# Patient Record
Sex: Female | Born: 1965 | Race: White | Hispanic: No | Smoking: Current every day smoker
Health system: Southern US, Community
[De-identification: ages and names within clinical notes are randomized; demographics above are authoritative.]

## PROBLEM LIST (undated history)

## (undated) DIAGNOSIS — E119 Type 2 diabetes mellitus without complications: Secondary | ICD-10-CM

## (undated) HISTORY — PX: JOINT REPLACEMENT: SHX530

## (undated) HISTORY — PX: ABDOMINAL HYSTERECTOMY: SHX81

## (undated) HISTORY — PX: CERVICAL FUSION: SHX112

---

## 2015-06-28 ENCOUNTER — Encounter: Payer: Self-pay | Admitting: *Deleted

## 2015-06-28 ENCOUNTER — Ambulatory Visit (INDEPENDENT_AMBULATORY_CARE_PROVIDER_SITE_OTHER): Payer: Self-pay

## 2015-06-28 ENCOUNTER — Ambulatory Visit
Admission: EM | Admit: 2015-06-28 | Discharge: 2015-06-28 | Disposition: A | Discharge status: Home / Self Care | Payer: Self-pay | Attending: Family Medicine | Admitting: Family Medicine

## 2015-06-28 DIAGNOSIS — M79671 Pain in right foot: Secondary | ICD-10-CM

## 2015-06-28 DIAGNOSIS — S96911A Strain of unspecified muscle and tendon at ankle and foot level, right foot, initial encounter: Secondary | ICD-10-CM

## 2015-06-28 HISTORY — DX: Type 2 diabetes mellitus without complications: E11.9

## 2015-06-28 MED ORDER — MELOXICAM 15 MG PO TABS: 15.0000 mg | ORAL_TABLET | Freq: Every day | ORAL | Status: DC

## 2015-06-28 NOTE — ED Provider Notes (Signed)
CSN: [id number]     Arrival date & time 06/28/15  1727 History   First MD Initiated Contact with Patient 06/28/15 1808      Nurses notes were reviewed. Chief Complaint  Patient presents with  . Foot Pain  Patient reports over the last week or so right foot pain. She states is painful when she walks. Asked pain has improved over the last day or 2 but she still has difficulty when she walks and ambulates. She is a diabetic who has been off medication because of locks of insurance after her hip surgery. She states that she is planning to get back onto her insurance and is finally brought smoker medication for diabetes since she is back at work. Patient however still smokes and does not have a problem buying her cigarettes and go without medication.  No family history of gout. Past surgical history she's had a hysterectomy and 2 joint replacements. Medical problems diabetes. She is allergic to penicillin and sulfa.  (Consider location/radiation/quality/duration/timing/severity/associated sxs/prior Treatment) Patient is a 50 y.o. female presenting with lower extremity pain. The history is provided by the patient. No language interpreter was used.  Foot Pain This is a new problem. The current episode started more than 1 week ago. The problem has been gradually improving. Pertinent negatives include no chest pain, no abdominal pain, no headaches and no shortness of breath. The symptoms are aggravated by walking. Nothing relieves the symptoms. She has tried nothing for the symptoms. The treatment provided no relief.    Past Medical History  Diagnosis Date  . Diabetes mellitus without complication [facility])    Past Surgical History  Procedure Laterality Date  . Abdominal hysterectomy    . Joint replacement    . Cervical fusion     History reviewed. No pertinent family history. Social History  Substance Use Topics  . Smoking status: Current Every Day Smoker  . Smokeless tobacco: Never Used  .  Alcohol Use: No   OB History    No data available     Review of Systems  Respiratory: Negative for shortness of breath.   Cardiovascular: Negative for chest pain.  Gastrointestinal: Negative for abdominal pain.  Neurological: Negative for headaches.  All other systems reviewed and are negative.   Allergies  Penicillins and Sulfa antibiotics  Home Medications   Prior to Admission medications   Medication Sig Start Date End Date Taking? Authorizing Provider  Loratadine 10 MG CAPS Take 1 capsule by mouth daily.   Yes Historical Provider, MD  metFORMIN (GLUCOPHAGE) 500 MG tablet Take 500 mg by mouth 2 (two) times daily with a meal.   Yes Historical Provider, MD  meloxicam (MOBIC) 15 MG tablet Take 1 tablet (15 mg total) by mouth daily. 06/28/15   Hassan Rowan, MD   Meds Ordered and Administered this Visit  Medications - No data to display  BP 139/67 mmHg  Pulse 87  Temp(Src) 98 F (36.7 C) (Oral)  Resp 18  Ht  (1.626 m)  Wt 220 lb (99.791 kg)  BMI 37.74 kg/m2  SpO2 97% No data found.   Physical Exam  Constitutional: She is oriented to person, place, and time. She appears well-developed and well-nourished.  HENT:  Head: Normocephalic and atraumatic.  Eyes: Conjunctivae are normal. Pupils are equal, round, and reactive to light.  Musculoskeletal: Normal range of motion.       Feet:  Neurological: She is alert and oriented to person, place, and time.  Skin: Skin is warm  and dry. No erythema.  Psychiatric: She has a normal mood and affect.  Vitals reviewed.   ED Course  Procedures (including critical care time)  Labs Review Labs Reviewed - No data to display  Imaging Review Dg Foot Complete Right  06/28/2015  CLINICAL DATA:  Pain for 2 weeks.  No recent trauma. EXAM: RIGHT FOOT COMPLETE - 3+ VIEW COMPARISON:  None. FINDINGS: Frontal, oblique, and lateral views were obtained. There is soft tissue swelling over the dorsum of the midfoot. No fracture or  dislocation. The joint spaces appear normal. No erosive change. IMPRESSION: Soft tissue swelling dorsally. No fracture or dislocation. No appreciable arthropathy. Electronically Signed   By: Bretta BangWilliam  Woodruff III M.D.   On: 06/28/2015 18:45     Visual Acuity Review  Right Eye Distance:   Left Eye Distance:   Bilateral Distance:    Right Eye Near:   Left Eye Near:    Bilateral Near:         MDM   1. Right foot strain, initial encounter   2. Foot pain, right    Extensive right foot is negative. Explained to her that this is moreover joint space on her right foot I would be more worried about gout. State this may be secondary to change in gait from the knee surgery. Recommend starting walk on her heel. At this time is no signs of fracture. But once she has insurance she may once be checked for gout especially the pain continues to come back. We'll discuss further gait techniques and possible follow-up with podiatrist if foot pain continues. We'll place on Mobic 15 mg 1 tablet a day   Note: This dictation was prepared with Dragon dictation along with smaller phrase technology. Any transcriptional errors that result from this process are unintentional.    Hassan RowanEugene Irbin Fines, MD 06/28/15 1910

## 2015-06-28 NOTE — ED Notes (Signed)
Patient started having symptoms of right foot pain for 2 weeks. Pain started with third toe of right foot and radiated to the top of her foot. Cause of pain is unknown.

## 2015-06-28 NOTE — Discharge Instructions (Signed)
Cryotherapy °Cryotherapy is when you put ice on your injury. Ice helps lessen pain and puffiness (swelling) after an injury. Ice works the best when you start using it in the first 24 to 48 hours after an injury. °HOME CARE °· Put a dry or damp towel between the ice pack and your skin. °· You may press gently on the ice pack. °· Leave the ice on for no more than 10 to 20 minutes at a time. °· Check your skin after 5 minutes to make sure your skin is okay. °· Rest at least 20 minutes between ice pack uses. °· Stop using ice when your skin loses feeling (numbness). °· Do not use ice on someone who cannot tell you when it hurts. This includes small children and people with memory problems (dementia). °GET HELP RIGHT AWAY IF: °· You have white spots on your skin. °· Your skin turns blue or pale. °· Your skin feels waxy or hard. °· Your puffiness gets worse. °MAKE SURE YOU:  °· Understand these instructions. °· Will watch your condition. °· Will get help right away if you are not doing well or get worse. °  °This information is not intended to replace advice given to you by your health care provider. Make sure you discuss any questions you have with your health care provider. °  °Document Released: 08/21/2007 Document Revised: 05/27/2011 Document Reviewed: 10/25/2010 °Elsevier Interactive Patient Education ©2016 [employer] ° °Muscle Strain °A muscle strain (pulled muscle) happens when a muscle is stretched beyond normal length. It happens when a sudden, violent force stretches your muscle too far. Usually, a few of the fibers in your muscle are torn. Muscle strain is common in athletes. Recovery usually takes 1-2 weeks. Complete healing takes 5-6 weeks.  °HOME CARE  °· Follow the PRICE method of treatment to help your injury get better. Do this the first 2-3 days after the injury: °¨ Protect. Protect the muscle to keep it from getting injured again. °¨ Rest. Limit your activity and rest the injured body part. °¨ Ice.  Put ice in a plastic bag. Place a towel between your skin and the bag. Then, apply the ice and leave it on from 15-20 minutes each hour. After the third day, switch to moist heat packs. °¨ Compression. Use a splint or elastic bandage on the injured area for comfort. Do not put it on too tightly. °¨ Elevate. Keep the injured body part above the level of your heart. °· Only take medicine as told by your doctor. °· Warm up before doing exercise to prevent future muscle strains. °GET HELP IF:  °· You have more pain or puffiness (swelling) in the injured area. °· You feel numbness, tingling, or notice a loss of strength in the injured area. °MAKE SURE YOU:  °· Understand these instructions. °· Will watch your condition. °· Will get help right away if you are not doing well or get worse. °  °This information is not intended to replace advice given to you by your health care provider. Make sure you discuss any questions you have with your health care provider. °  °Document Released: 12/12/2007 Document Revised: 12/23/2012 Document Reviewed: 10/01/2012 °Elsevier Interactive Patient Education ©2016 Elsevier Inc. ° °

## 2016-03-07 ENCOUNTER — Ambulatory Visit
Admission: EM | Admit: 2016-03-07 | Discharge: 2016-03-07 | Disposition: A | Discharge status: Home / Self Care | Payer: Managed Care, Other (non HMO) | Attending: Internal Medicine | Admitting: Internal Medicine

## 2016-03-07 ENCOUNTER — Ambulatory Visit (INDEPENDENT_AMBULATORY_CARE_PROVIDER_SITE_OTHER): Payer: Managed Care, Other (non HMO)

## 2016-03-07 DIAGNOSIS — I517 Cardiomegaly: Secondary | ICD-10-CM | POA: Diagnosis not present

## 2016-03-07 DIAGNOSIS — F17200 Nicotine dependence, unspecified, uncomplicated: Secondary | ICD-10-CM | POA: Insufficient documentation

## 2016-03-07 DIAGNOSIS — J209 Acute bronchitis, unspecified: Secondary | ICD-10-CM | POA: Insufficient documentation

## 2016-03-07 DIAGNOSIS — Z7984 Long term (current) use of oral hypoglycemic drugs: Secondary | ICD-10-CM | POA: Insufficient documentation

## 2016-03-07 DIAGNOSIS — E119 Type 2 diabetes mellitus without complications: Secondary | ICD-10-CM | POA: Diagnosis not present

## 2016-03-07 DIAGNOSIS — R079 Chest pain, unspecified: Secondary | ICD-10-CM | POA: Diagnosis not present

## 2016-03-07 DIAGNOSIS — Z880 Allergy status to penicillin: Secondary | ICD-10-CM | POA: Insufficient documentation

## 2016-03-07 DIAGNOSIS — R0789 Other chest pain: Secondary | ICD-10-CM

## 2016-03-07 DIAGNOSIS — Z8709 Personal history of other diseases of the respiratory system: Secondary | ICD-10-CM

## 2016-03-07 MED ORDER — ALBUTEROL SULFATE HFA 108 (90 BASE) MCG/ACT IN AERS: 1.0000 | INHALATION_SPRAY | Freq: Four times a day (QID) | RESPIRATORY_TRACT | 0 refills | Status: AC | PRN

## 2016-03-07 MED ORDER — PREDNISONE 50 MG PO TABS: 50.0000 mg | ORAL_TABLET | Freq: Every day | ORAL | 0 refills | Status: AC

## 2016-03-07 MED ORDER — NAPROXEN 500 MG PO TABS: 500.0000 mg | ORAL_TABLET | Freq: Two times a day (BID) | ORAL | 0 refills | Status: DC

## 2016-03-07 MED ORDER — IPRATROPIUM-ALBUTEROL 0.5-2.5 (3) MG/3ML IN SOLN
3.0000 mL | Freq: Once | RESPIRATORY_TRACT | Status: AC
  Administered 2016-03-07: 3 mL via RESPIRATORY_TRACT

## 2016-03-07 NOTE — ED Provider Notes (Signed)
[facility]    CSN: [id number] Arrival date & time: 03/07/16  [id number]     History   Chief Complaint Chief Complaint  Patient presents with  . Chest Pain    Right Rib pain    HPI Sarah Woodard is a 50 y.o. female. she presents today with recent history probable bronchitis/hard coughing onset about 4 weeks ago.  Onset of discomfort under R shoulder blade, rads around to R anterior chest, about 2 weeks ago.  Pleuritic quality, worse with some torso/shoulder motions and with cough/deep breath.  Constant.  Seems to be getting worse.  No leg pain/swelling.  Cough improving, some production.  No fever.  No N/V.  Hx DM, smoking.  Father onset CAD age 7850s.    HPI  Past Medical History:  Diagnosis Date  . Diabetes mellitus without complication [facility](HCC)      Past Surgical History:  Procedure Laterality Date  . ABDOMINAL HYSTERECTOMY    . CERVICAL FUSION    . JOINT REPLACEMENT         Home Medications    Prior to Admission medications   Medication Sig Start Date End Date Taking? Authorizing Provider  FLUoxetine (PROZAC) 20 MG tablet Take 20 mg by mouth daily.   Yes Historical Provider, MD  metFORMIN (GLUCOPHAGE) 500 MG tablet Take 500 mg by mouth 2 (two) times daily with a meal.   Yes Historical Provider, MD  albuterol (PROVENTIL HFA;VENTOLIN HFA) 108 (90 Base) MCG/ACT inhaler Inhale 1-2 puffs into the lungs every 6 (six) hours as needed for wheezing or shortness of breath. 03/07/16   Eustace MooreLaura W Murray, MD  naproxen (NAPROSYN) 500 MG tablet Take 1 tablet (500 mg total) by mouth 2 (two) times daily. 03/07/16   Eustace MooreLaura W Murray, MD  predniSONE (DELTASONE) 50 MG tablet Take 1 tablet (50 mg total) by mouth daily. 03/07/16   Eustace MooreLaura W Murray, MD    Family History Family History  Problem Relation Age of Onset  . Cancer Mother   . Heart attack Father   . Cancer Father     Social History Social History  Substance Use Topics  . Smoking status: Current Every Day Smoker  .  Smokeless tobacco: Never Used  . Alcohol use No     Allergies   Penicillins and Sulfa antibiotics   Review of Systems Review of Systems  All other systems reviewed and are negative.    Physical Exam Triage Vital Signs ED Triage Vitals  Enc Vitals Group     BP 03/07/16 1008 131/66     Pulse Rate 03/07/16 1008 68     Resp 03/07/16 1008 18     Temp 03/07/16 1008 98.1 F (36.7 C)     Temp Source 03/07/16 1008 Oral     SpO2 03/07/16 1008 98 %     Weight 03/07/16 1009 215 lb (97.5 kg)     Height 03/07/16 1009 5\' 4"  (1.626 m)     Pain Score 03/07/16 1010 4   Updated Vital Signs BP 131/66 (BP Location: Left Arm)   Pulse 68   Temp 98.1 F (36.7 C) (Oral)   Resp 18   Ht 5\' 4"  (1.626 m)   Wt 215 lb (97.5 kg)   SpO2 98%   BMI 36.90 kg/m  Physical Exam  Constitutional: She is oriented to person, place, and time. No distress.  Alert, nicely groomed  HENT:  Head: Atraumatic.  Eyes:  Conjugate gaze, no eye redness/drainage  Neck: Neck supple.  No change in discomfort with neck movement including lateral rotation, flexion/extension  Cardiovascular: Normal rate and regular rhythm.   Pulmonary/Chest: No respiratory distress. She has no wheezes. She has no rales.  Slightly coarse but symmetric breath sounds throughout, somewhat diminished posteriorly  Abdominal: She exhibits no distension.  Musculoskeletal: Normal range of motion.  No leg swelling Movement of the right shoulder/internal rotation slightly increases discomfort. Able to raise arm overhead, and externally rotate without difficulty  Neurological: She is alert and oriented to person, place, and time.  Skin: Skin is warm and dry.  No cyanosis  Nursing note and vitals reviewed.    UC Treatments / Results   EKG NSR, no acute ST or T wave changes Voltage criteria for LVH  Radiology EXAM: CHEST 2 VIEW  COMPARISON: None.  FINDINGS: Lower cervical spine fixation. Midline trachea. Normal heart size and  mediastinal contours. No pleural effusion or pneumothorax. Clear lungs.  IMPRESSION: No acute cardiopulmonary disease.   Electronically Signed By: Jeronimo GreavesKyle Talbot M.D. On: 03/07/2016 11:09  Procedures Procedures (including critical care time)  Medications Ordered in UC Medications  ipratropium-albuterol (DUONEB) 0.5-2.5 (3) MG/3ML nebulizer solution 3 mL (3 mLs Nebulization Given 03/07/16 1051)     Final Clinical Impressions(s) / UC Diagnoses   Final diagnoses:  Chest wall pain  History of acute bronchitis with bronchospasm   ECG and chest xray did not suggest a particular cause for symptoms today.  Recent suspected bronchitis may have inflamed the soft tissues in your inner and outer chest, and may take some time to subside. Prescriptions for prednisone, albuterol inhaler, and naproxen (a twice daily anti inflammatory) were sent to the [facility] in [place].  Physical therapy may be an additional option for resolving discomfort.    New Prescriptions Discharge Medication List as of 03/07/2016 11:26 AM    START taking these medications   Details  albuterol (PROVENTIL HFA;VENTOLIN HFA) 108 (90 Base) MCG/ACT inhaler Inhale 1-2 puffs into the lungs every 6 (six) hours as needed for wheezing or shortness of breath., Starting Thu 03/07/2016, Normal    naproxen (NAPROSYN) 500 MG tablet Take 1 tablet (500 mg total) by mouth 2 (two) times daily., Starting Thu 03/07/2016, Normal    predniSONE (DELTASONE) 50 MG tablet Take 1 tablet (50 mg total) by mouth daily., Starting Thu 03/07/2016, Normal         Eustace MooreLaura W Murray, MD 03/09/16 [phone]

## 2016-03-07 NOTE — ED Triage Notes (Addendum)
Pt c/o right rib pain, and it radiates around to her back, She did have a bad cough a few weeks ago and it has been hurting ever since. She has been experiencing this pain for about 2 weeks.

## 2016-03-07 NOTE — Discharge Instructions (Addendum)
ECG and chest xray did not suggest a particular cause for symptoms today.  Recent suspected bronchitis may have inflamed the soft tissues in your inner and outer chest, and may take some time to subside. Prescriptions for prednisone, albuterol inhaler, and naproxen (a twice daily anti inflammatory) were sent to the [facility] in [place].  Physical therapy may be an additional option for resolving discomfort.

## 2016-10-10 ENCOUNTER — Ambulatory Visit
Admission: EM | Admit: 2016-10-10 | Discharge: 2016-10-10 | Disposition: A | Discharge status: Home / Self Care | Payer: 59 | Attending: Family Medicine | Admitting: Family Medicine

## 2016-10-10 DIAGNOSIS — S39012A Strain of muscle, fascia and tendon of lower back, initial encounter: Secondary | ICD-10-CM | POA: Diagnosis not present

## 2016-10-10 DIAGNOSIS — J320 Chronic maxillary sinusitis: Secondary | ICD-10-CM

## 2016-10-10 LAB — URINALYSIS, COMPLETE (UACMP) WITH MICROSCOPIC
Bilirubin Urine: NEGATIVE
GLUCOSE, UA: NEGATIVE mg/dL
Hgb urine dipstick: NEGATIVE
Ketones, ur: NEGATIVE mg/dL
Leukocytes, UA: NEGATIVE
Nitrite: POSITIVE — AB
PROTEIN: NEGATIVE mg/dL
SPECIFIC GRAVITY, URINE: 1.015 (ref 1.005–1.030)
pH: 6 (ref 5.0–8.0)

## 2016-10-10 MED ORDER — NAPROXEN 500 MG PO TABS: 500.0000 mg | ORAL_TABLET | Freq: Two times a day (BID) | ORAL | 0 refills | Status: AC

## 2016-10-10 MED ORDER — CEFUROXIME AXETIL 250 MG PO TABS: ORAL_TABLET | ORAL | 0 refills | Status: AC

## 2016-10-10 MED ORDER — KETOROLAC TROMETHAMINE 60 MG/2ML IM SOLN
60.0000 mg | Freq: Once | INTRAMUSCULAR | Status: AC
  Administered 2016-10-10: 60 mg via INTRAMUSCULAR

## 2016-10-10 MED ORDER — METAXALONE 800 MG PO TABS: 800.0000 mg | ORAL_TABLET | Freq: Three times a day (TID) | ORAL | 0 refills | Status: AC

## 2016-10-10 NOTE — ED Triage Notes (Signed)
Pt states she has left flank pain and wonders if she has a UTI. Her PCP told her she had infected urine in April. Also states she thinks she has a sinus infection. Facial and right jaw pain and congestion.

## 2016-10-10 NOTE — ED Provider Notes (Signed)
CSN: [id number]     Arrival date & time 10/10/16  1200 History   First MD Initiated Contact with Patient 10/10/16 1246     Chief Complaint  Patient presents with  . Facial Pain  . Back Pain   (Consider location/radiation/quality/duration/timing/severity/associated sxs/prior Treatment) HPI  A 51 year old female who presents with a left back and flank pain she's had for 3 days. She states that the pain is a 7-8 out of 10 at the present time. She feels it mostly on the left side but will sometimes have it on the right as well. He states the pain goes from her back into her flank and near the groin. She denies any nausea or vomiting. She does not remember any specific incident that may have caused her to have back pain. She was at work today and had to leave because the pain was becoming more severe. Most her pain appears to be isolated to the back paraspinous muscles on the left. She states movement seems to worsen although she does feel it all the time. She has no previous history of back problems. She's never had kidney stone in the past.   She's also complaining of sinus pressure and pain on the right eye indicating the malar region. It also goes along her lower jaw.His had the pain for approximately 2 weeks. She denies any fever or chills.. Trace at most the pain is in the malar region but will be very severe along her jawline as well.        Past Medical History:  Diagnosis Date  . Diabetes mellitus without complication [facility])    Past Surgical History:  Procedure Laterality Date  . ABDOMINAL HYSTERECTOMY    . CERVICAL FUSION    . JOINT REPLACEMENT     Family History  Problem Relation Age of Onset  . Cancer Mother   . Heart attack Father   . Cancer Father    Social History  Substance Use Topics  . Smoking status: Current Every Day Smoker    Packs/day: 2.00    Types: Cigarettes  . Smokeless tobacco: Never Used  . Alcohol use No   OB History    No data available      Review of Systems  Constitutional: Positive for activity change. Negative for appetite change, chills, fatigue and fever.  Gastrointestinal: Positive for abdominal pain. Negative for diarrhea, nausea and vomiting.  Genitourinary: Positive for flank pain. Negative for dysuria, frequency, hematuria, urgency and vaginal discharge.  Musculoskeletal: Positive for back pain.  All other systems reviewed and are negative.   Allergies  Penicillins and Sulfa antibiotics  Home Medications   Prior to Admission medications   Medication Sig Start Date End Date Taking? Authorizing Provider  Dulaglutide (TRULICITY) 1.5 MG/0.5ML SOPN Inject into the skin.   Yes [provider]  venlafaxine (EFFEXOR) 75 MG tablet Take 75 mg by mouth 2 (two) times daily.   Yes [provider]  albuterol (PROVENTIL HFA;VENTOLIN HFA) 108 (90 Base) MCG/ACT inhaler Inhale 1-2 puffs into the lungs every 6 (six) hours as needed for wheezing or shortness of breath. 03/07/16   Eustace Moore, MD  cefUROXime (CEFTIN) 250 MG tablet Take one tablet BID with food 10/10/16   Lutricia Feil, PA-C  FLUoxetine (PROZAC) 20 MG tablet Take 20 mg by mouth daily.    [provider]  metaxalone (SKELAXIN) 800 MG tablet Take 1 tablet (800 mg total) by mouth 3 (three) times daily. 10/10/16   Ovid Curd  P, PA-C  metaxalone (SKELAXIN) 800 MG tablet Take 1 tablet (800 mg total) by mouth 3 (three) times daily. 10/10/16   Lutricia Feiloemer, William P, PA-C  metFORMIN (GLUCOPHAGE) 500 MG tablet Take 500 mg by mouth 2 (two) times daily with a meal.    [provider]  naproxen (NAPROSYN) 500 MG tablet Take 1 tablet (500 mg total) by mouth 2 (two) times daily with a meal. 10/10/16   Lutricia Feiloemer, William P, PA-C  predniSONE (DELTASONE) 50 MG tablet Take 1 tablet (50 mg total) by mouth daily. 03/07/16   Eustace MooreMurray, Laura W, MD   Meds Ordered and Administered this Visit   Medications  ketorolac (TORADOL) injection 60 mg (60 mg  Intramuscular Given 10/10/16 1326)    BP 132/68 (BP Location: Left Arm)   Pulse 77   Temp 98.1 F (36.7 C) (Oral)   Resp 18   Ht 5\' 4"  (1.626 m)   Wt 215 lb (97.5 kg)   SpO2 97%   BMI 36.90 kg/m  No data found.   Physical Exam  Constitutional: She is oriented to person, place, and time. She appears well-developed and well-nourished. No distress.  HENT:  Head: Normocephalic.  Patient has tenderness to percussion over the frontal but more so over the maxillary sinuses all right-sided.on the left.  Eyes: Pupils are equal, round, and reactive to light.  Neck: Normal range of motion. Neck supple.  Abdominal: Soft. Bowel sounds are normal. She exhibits no distension. There is tenderness. There is no rebound and no guarding.  Abdomen reveals a tenderness diffusely over the abdomen but to perhaps slightly more to the left lower quadrant. There is no guarding no rebound present. There is no distention appreciated. BS  Are present and normal.  Musculoskeletal: Normal range of motion. She exhibits tenderness.  Examination was performed in the presence of Harriett SineNancy, Geologist, engineeringN chaperone and assistant. Patient has a level pelvis in stance. She is able to forward flex with her hands to the level of her mid shin. Resuming upright posture was mildly uncomfortable. Left lateral flexion caused pain in the paraspinous muscles in the mid upper lumbar segments. Thoracic rotation did not cause difficulty.. Patient is able to toe and heel walk. Sensation is intact to light touch throughout. DTRs show a decrease on the right at the knee grade 1+ over 4 and 2+ over 4 on the left. Ankle reflexes are the same bilaterally. EHL peroneal and anterior tibialis are strong to clinical testing. Maximum tenderness is in the paraspinous muscles at approximately the L2-3 levels.  Neurological: She is alert and oriented to person, place, and time.  Skin: Skin is warm and dry. She is not diaphoretic.  Psychiatric: She has a normal mood  and affect. Her behavior is normal. Judgment and thought content normal.  Nursing note and vitals reviewed.   Urgent Care Course     Procedures (including critical care time)  Labs Review Labs Reviewed  URINALYSIS, COMPLETE (UACMP) WITH MICROSCOPIC - Abnormal; Notable for the following:       Result Value   APPearance HAZY (*)    Nitrite POSITIVE (*)    Squamous Epithelial / LPF 6-30 (*)    Bacteria, UA MANY (*)    All other components within normal limits  URINE CULTURE    Imaging Review No results found.   Visual Acuity Review  Right Eye Distance:   Left Eye Distance:   Bilateral Distance:    Right Eye Near:   Left Eye Near:  Bilateral Near:     Medications  ketorolac (TORADOL) injection 60 mg (60 mg Intramuscular Given 10/10/16 1326)      MDM   1. Strain of lumbar region, initial encounter   2. Chronic maxillary sinusitis    New Prescriptions   CEFUROXIME (CEFTIN) 250 MG TABLET    Take one tablet BID with food   METAXALONE (SKELAXIN) 800 MG TABLET    Take 1 tablet (800 mg total) by mouth 3 (three) times daily.   METAXALONE (SKELAXIN) 800 MG TABLET    Take 1 tablet (800 mg total) by mouth 3 (three) times daily.   NAPROXEN (NAPROSYN) 500 MG TABLET    Take 1 tablet (500 mg total) by mouth 2 (two) times daily with a meal.  Plan: 1. Test/x-ray results and diagnosis reviewed with patient 2. rx as per orders; risks, benefits, potential side effects reviewed with patient 3. Recommend supportive treatment with Rest and symptom avoidance. Cautioned regarding use of Skelaxin with activities requiring concentration and judgment and not to drive on taking the medication. I also recommended the use of Flonase along with the Ceftin for her sinusitis. I recommended she follow-up with her primary care physician next week. I told her that she could possibly have a kidney stone although the UA does not support it. It does not definitely rule it out. Her pain worsens she should  follow-up at the emergency department requiring imaging possibly. 4. F/u prn if symptoms worsen or don't improve     Lutricia FeilRoemer, William P, PA-C 10/10/16 1351

## 2016-10-12 LAB — URINE CULTURE: Culture: >=100000 — AB

## 2016-10-13 ENCOUNTER — Telehealth: Payer: Self-pay

## 2016-10-13 NOTE — Telephone Encounter (Signed)
Post ED Visit - Positive Culture Follow-up  Culture report reviewed by antimicrobial stewardship pharmacist:  []  Enzo BiNathan Batchelder, Pharm.D. []  Celedonio MiyamotoJeremy Frens, [address]Ph[address] BCPS AQ-ID []  Garvin FilaMike Maccia, Pharm.D., BCPS []  Georgina PillionElizabeth Martin, [address]Ph[address] BCPS []  ElmerMinh Pham, [address]Ph[address]., BCPS, AAHIVP []  Estella HuskMichelle Turner, Pharm.D., BCPS, AAHIVP []  Lysle Pearlachel Rumbarger, PharmD, BCPS []  Casilda Carlsaylor Stone, PharmD, BCPS []  Pollyann SamplesAndy Johnston, PharmD, BCPS [facility] LLCBen Mancheril Pharm D Positive urine culture Treated with Cefuroxime, organism sensitive to the same and no further patient follow-up is required at this time.  Jerry CarasCullom, Tekeyah Santiago Burnett 10/13/2016, 11:34 AM

## 2017-09-30 IMAGING — CR DG FOOT COMPLETE 3+V*R*
3 series · 3 of 3 positions shown · non-contrast
Comparison: None.

CLINICAL DATA: Pain for 2 weeks.  No recent trauma.

EXAM:
RIGHT FOOT COMPLETE - 3+ VIEW

[foot ap]
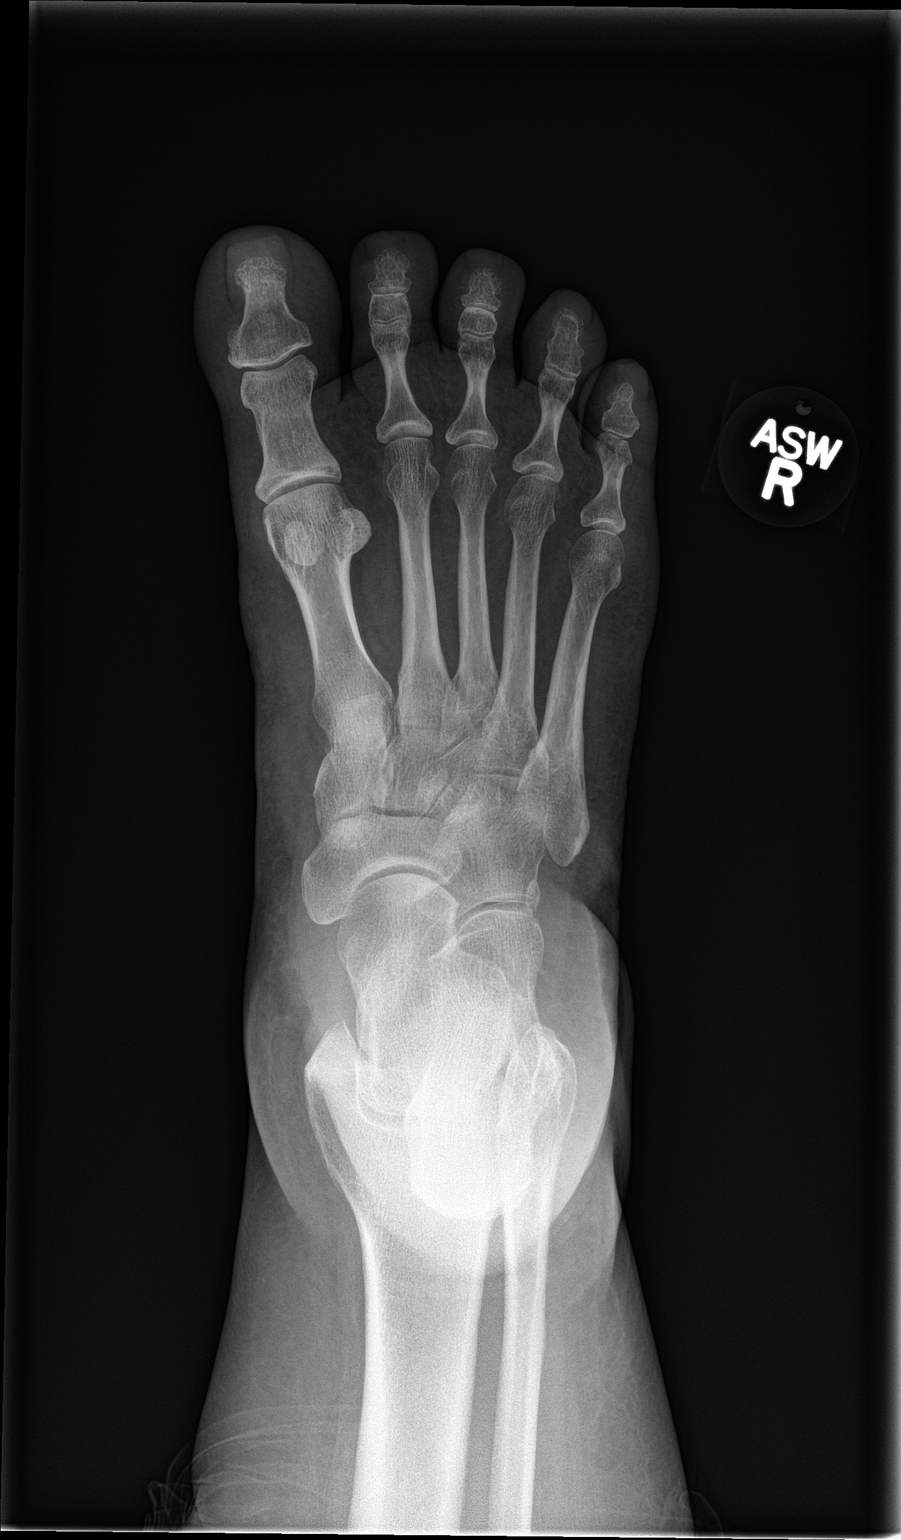

[foot obl]
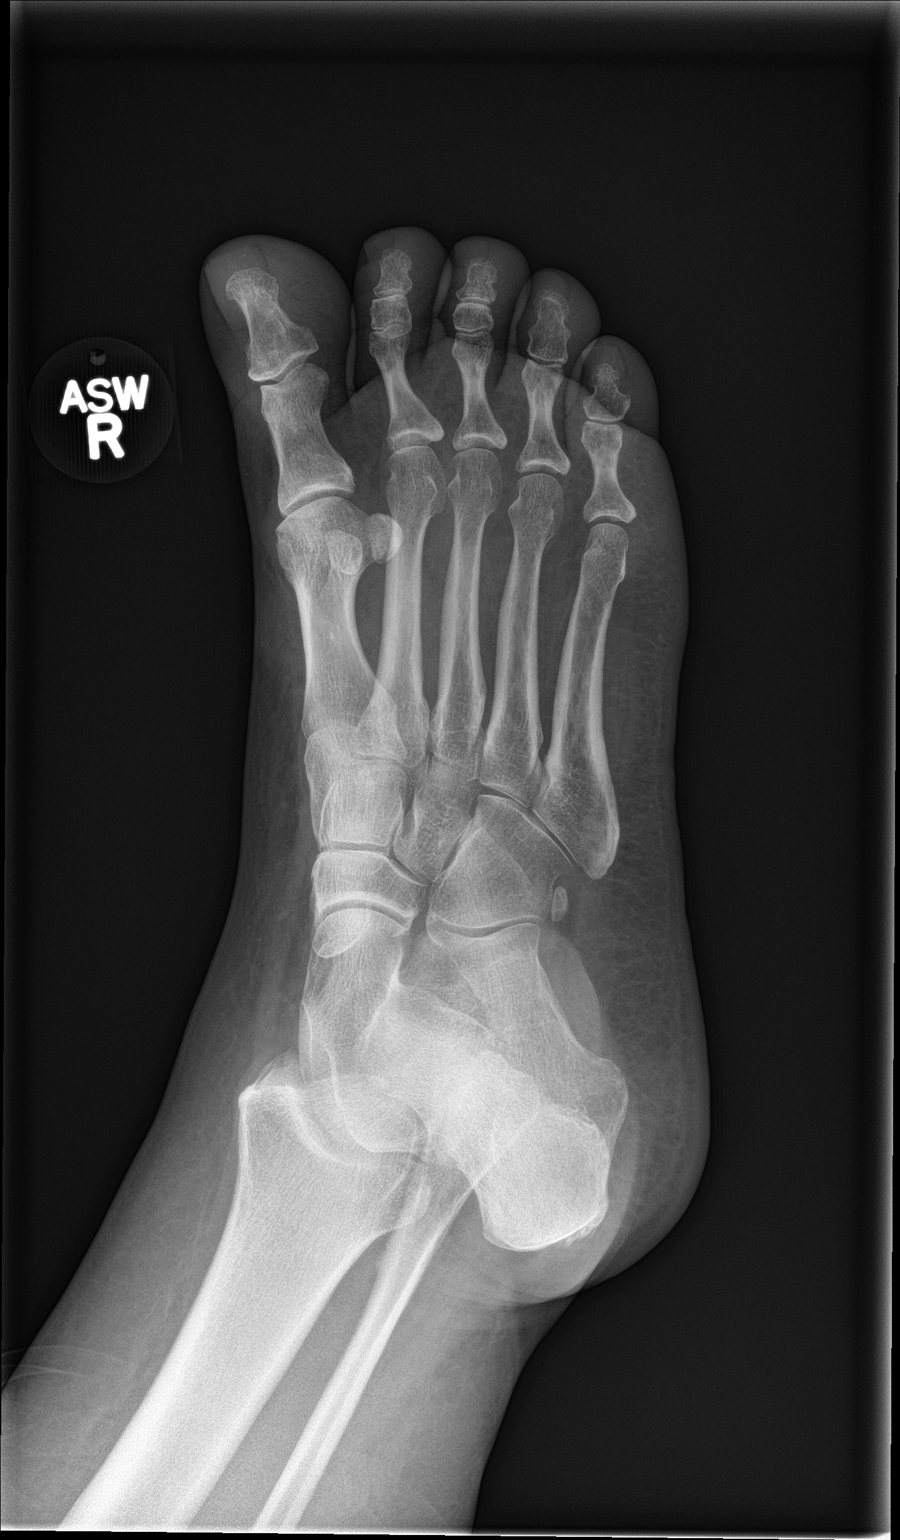

[foot lat]
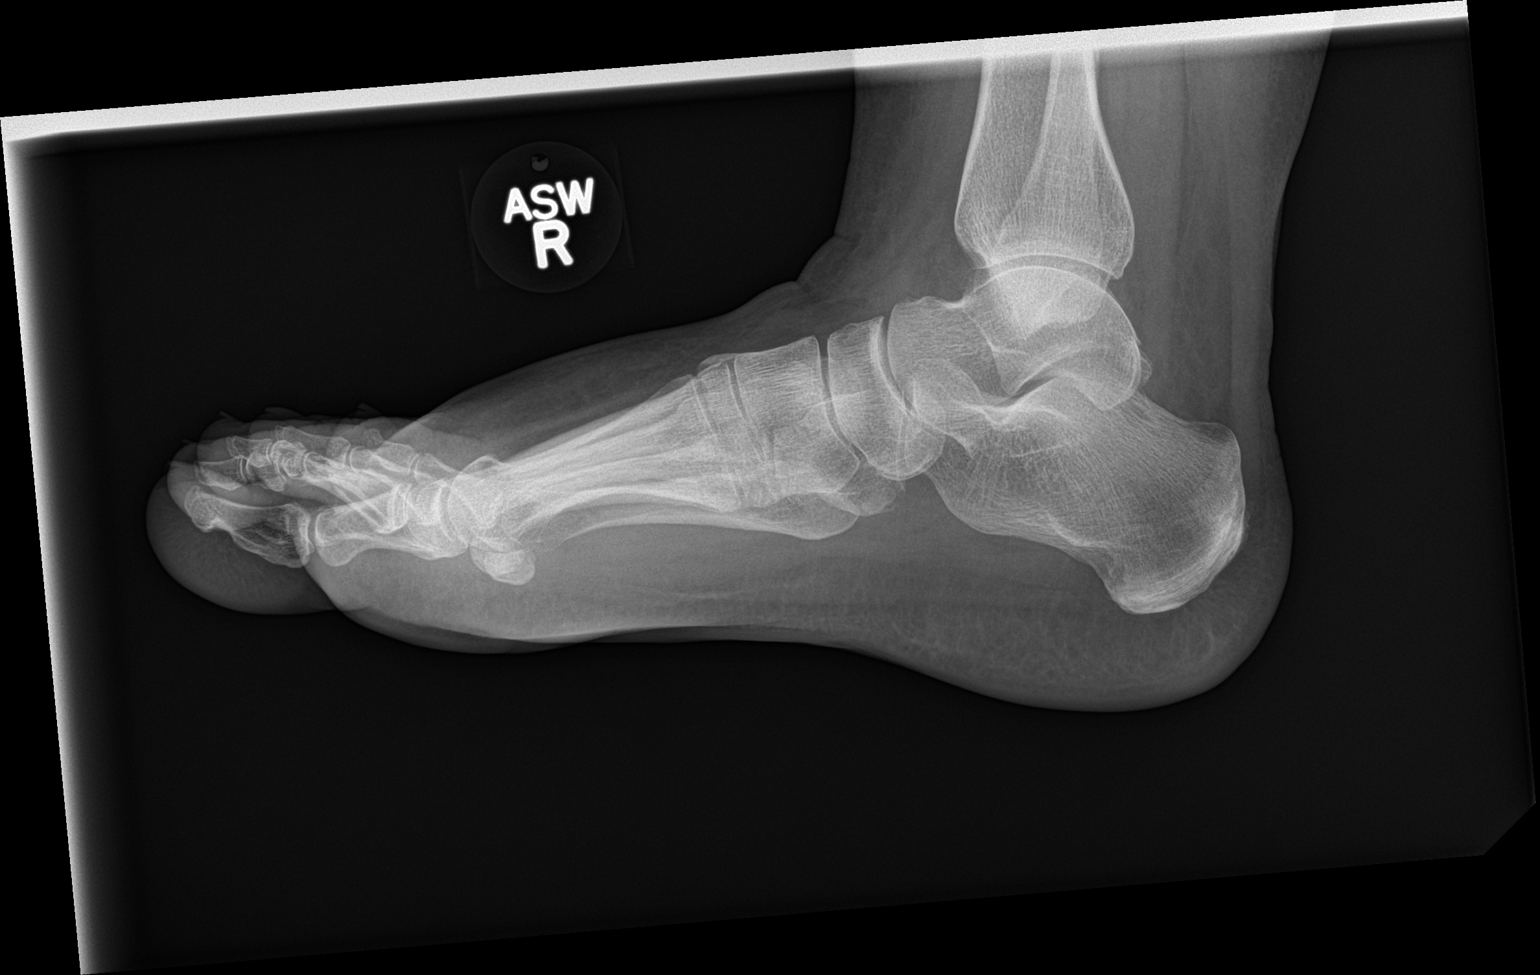

[3 of 3 positions shown; findings below may reference images not displayed]

FINDINGS: Frontal, oblique, and lateral views were obtained. There is soft
tissue swelling over the dorsum of the midfoot. No fracture or
dislocation. The joint spaces appear normal. No erosive change.
IMPRESSION: Soft tissue swelling dorsally. No fracture or dislocation. No
appreciable arthropathy.

## 2018-06-10 IMAGING — CR DG CHEST 2V
3 series · 3 of 3 positions shown · non-contrast
Comparison: None.

CLINICAL DATA: Pt has been sick with sinus chest congestion x 1
month. Cough has improved but having right post mid around to
anterior chest pain x 2 weeks. Current smoker, hx pneumonia,
bronchitis and asthma

EXAM:
CHEST  2 VIEW

[chest pa]
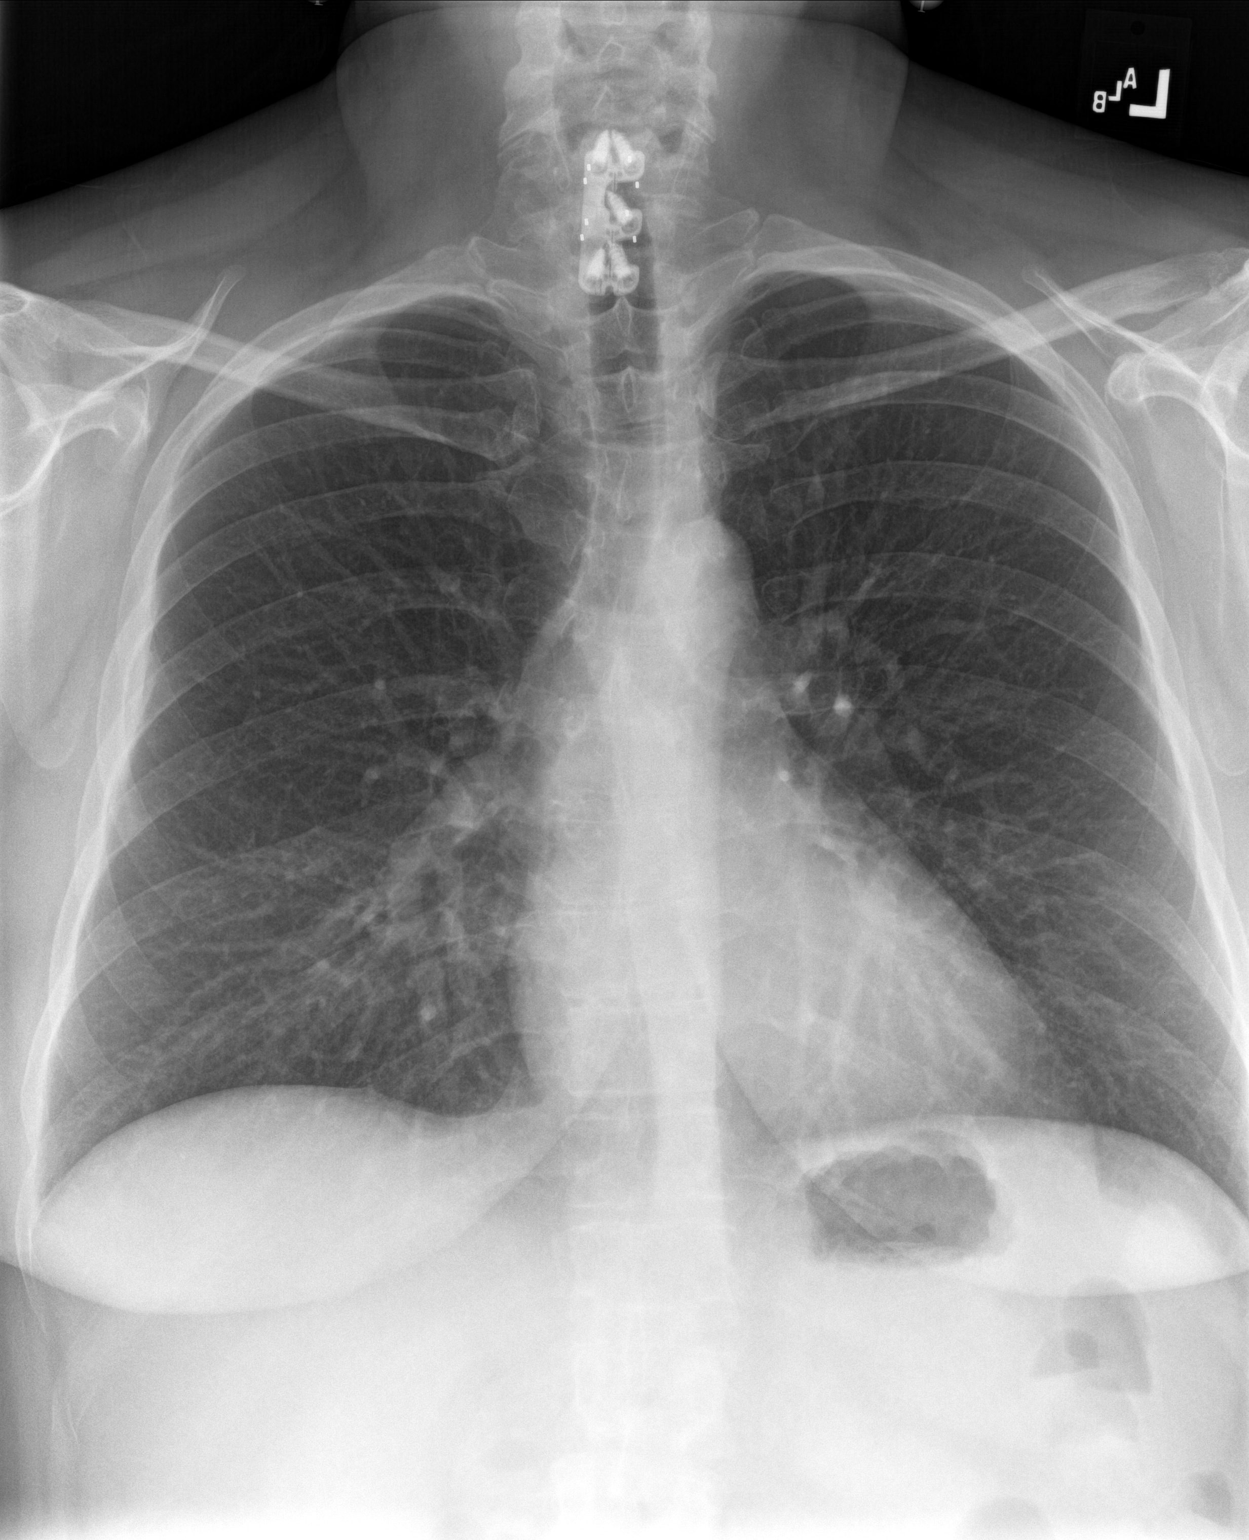

[chest lat (1 of 2)]
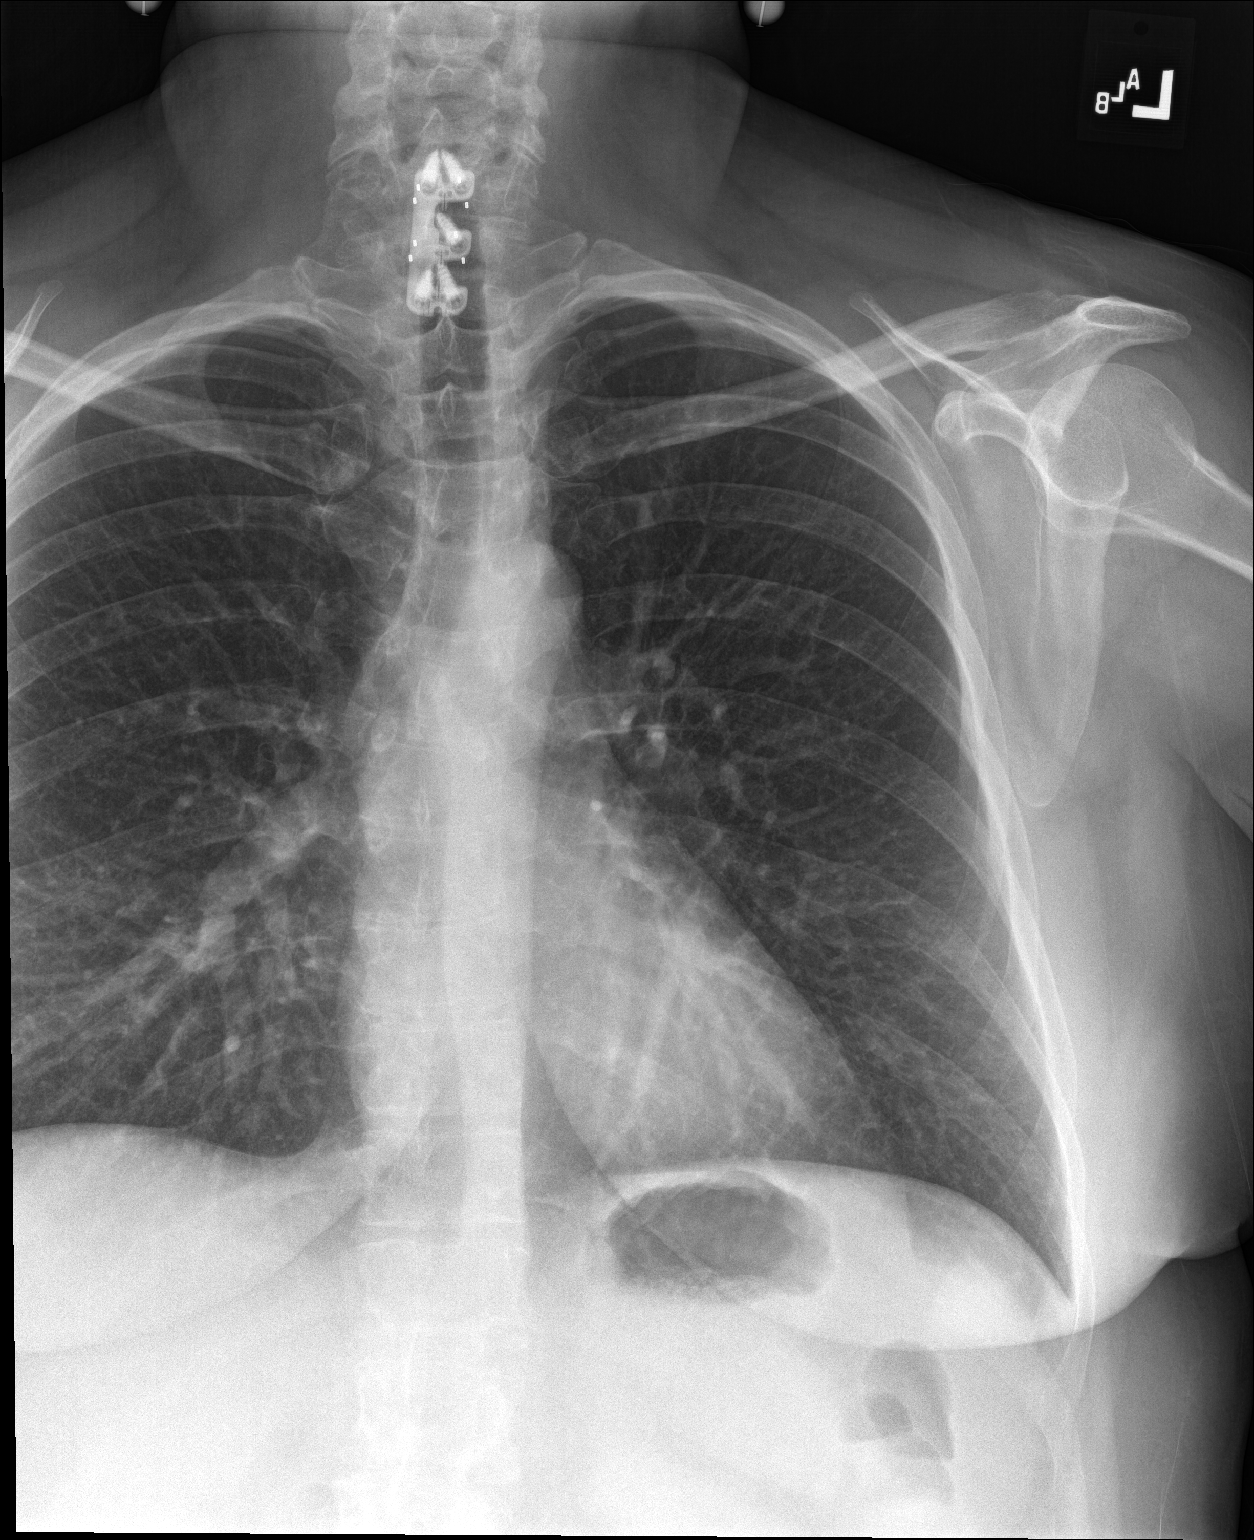

[chest lat (2 of 2)]
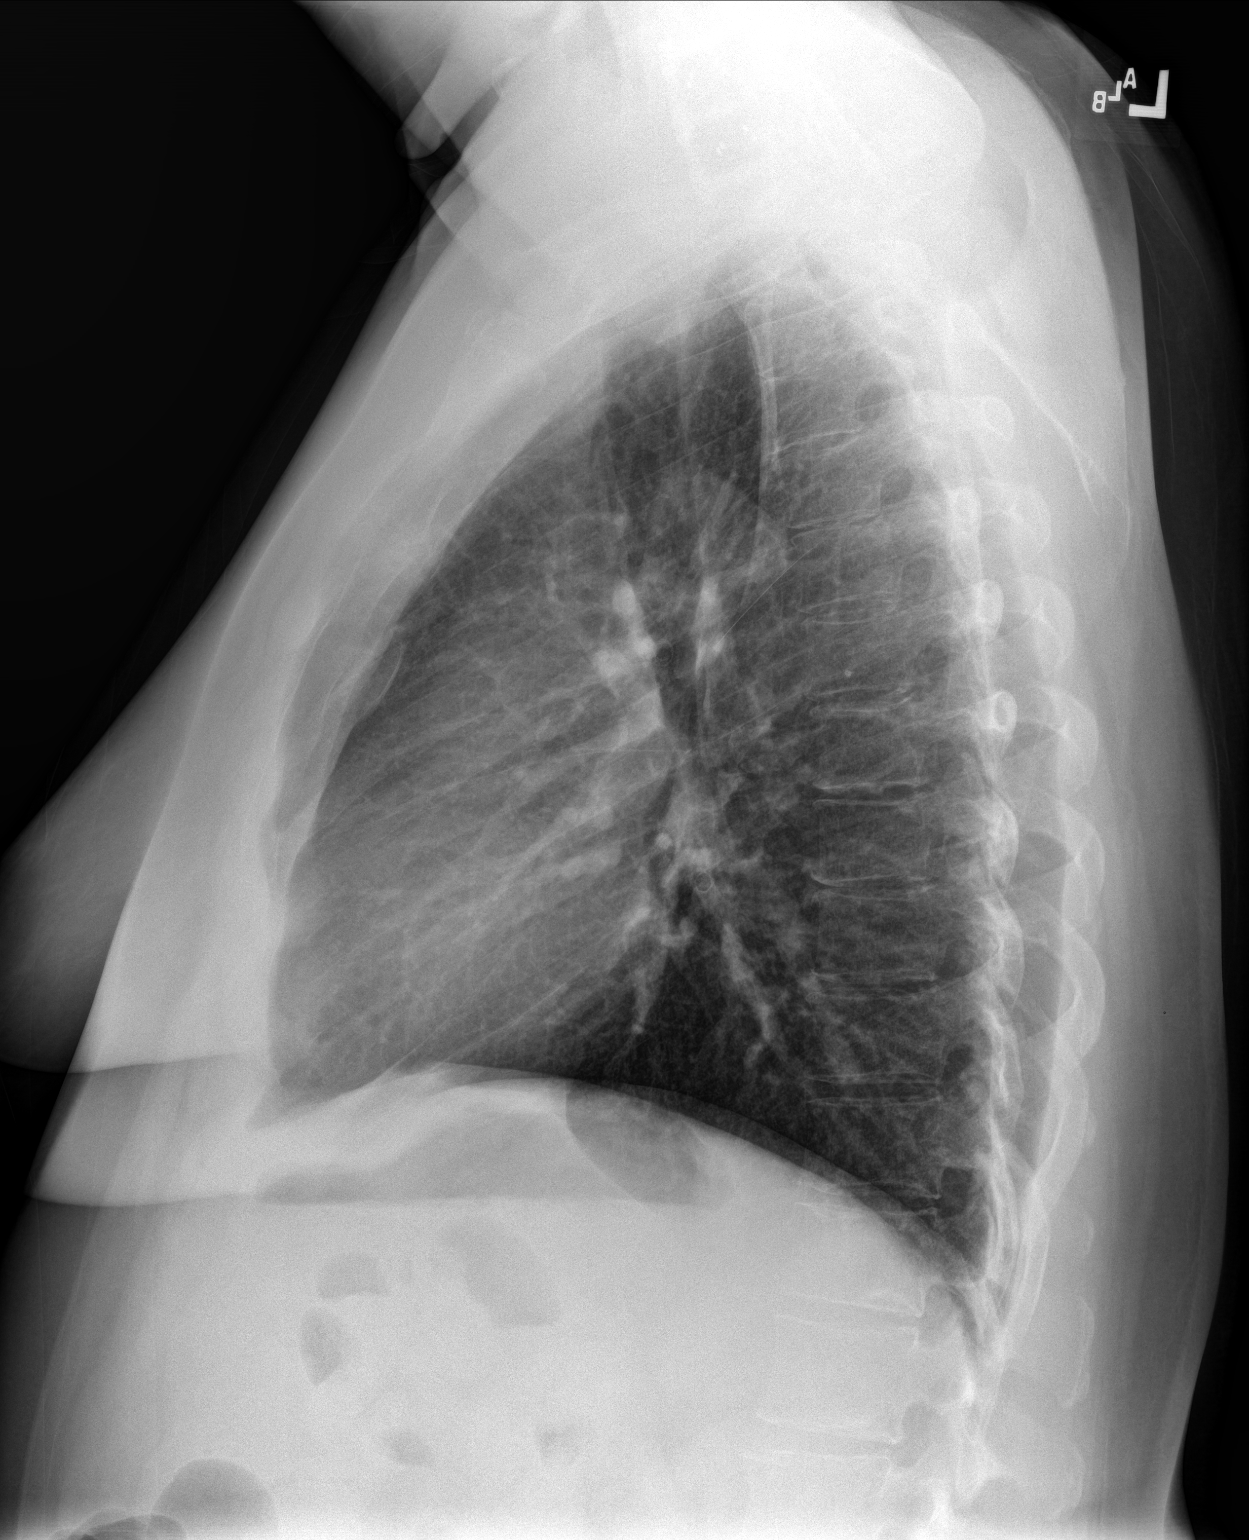

[3 of 3 positions shown; findings below may reference images not displayed]

FINDINGS: Lower cervical spine fixation. Midline trachea. Normal heart size
and mediastinal contours. No pleural effusion or pneumothorax. Clear
lungs.
IMPRESSION: No acute cardiopulmonary disease.
